# Patient Record
Sex: Female | Born: 1985 | Race: White | Hispanic: No | Marital: Single | Smoking: Never smoker
Health system: Southern US, Community
[De-identification: ages and names within clinical notes are randomized; demographics above are authoritative.]

---

## 2017-06-30 DIAGNOSIS — Z01419 Encounter for gynecological examination (general) (routine) without abnormal findings: Secondary | ICD-10-CM | POA: Diagnosis not present

## 2017-06-30 DIAGNOSIS — Z30431 Encounter for routine checking of intrauterine contraceptive device: Secondary | ICD-10-CM | POA: Diagnosis not present

## 2017-06-30 DIAGNOSIS — Z3009 Encounter for other general counseling and advice on contraception: Secondary | ICD-10-CM | POA: Diagnosis not present

## 2017-07-22 DIAGNOSIS — F909 Attention-deficit hyperactivity disorder, unspecified type: Secondary | ICD-10-CM | POA: Diagnosis not present

## 2018-01-20 DIAGNOSIS — Z0000 Encounter for general adult medical examination without abnormal findings: Secondary | ICD-10-CM | POA: Diagnosis not present

## 2018-01-20 DIAGNOSIS — Z136 Encounter for screening for cardiovascular disorders: Secondary | ICD-10-CM | POA: Diagnosis not present

## 2018-01-20 DIAGNOSIS — Z131 Encounter for screening for diabetes mellitus: Secondary | ICD-10-CM | POA: Diagnosis not present

## 2018-07-04 DIAGNOSIS — R4781 Slurred speech: Secondary | ICD-10-CM | POA: Diagnosis not present

## 2018-07-04 DIAGNOSIS — I10 Essential (primary) hypertension: Secondary | ICD-10-CM | POA: Diagnosis not present

## 2018-07-04 DIAGNOSIS — R410 Disorientation, unspecified: Secondary | ICD-10-CM | POA: Diagnosis not present

## 2018-07-05 ENCOUNTER — Emergency Department (HOSPITAL_COMMUNITY): Payer: BLUE CROSS/BLUE SHIELD

## 2018-07-05 ENCOUNTER — Encounter (HOSPITAL_COMMUNITY): Payer: Self-pay | Admitting: Emergency Medicine

## 2018-07-05 ENCOUNTER — Emergency Department (HOSPITAL_COMMUNITY)
Admission: EM | Admit: 2018-07-05 | Discharge: 2018-07-05 | Disposition: A | Discharge status: Home / Self Care | Payer: BLUE CROSS/BLUE SHIELD | Attending: Emergency Medicine | Admitting: Emergency Medicine

## 2018-07-05 DIAGNOSIS — S0990XA Unspecified injury of head, initial encounter: Secondary | ICD-10-CM | POA: Diagnosis not present

## 2018-07-05 DIAGNOSIS — S060X0A Concussion without loss of consciousness, initial encounter: Secondary | ICD-10-CM | POA: Diagnosis not present

## 2018-07-05 DIAGNOSIS — R410 Disorientation, unspecified: Secondary | ICD-10-CM | POA: Diagnosis not present

## 2018-07-05 DIAGNOSIS — Y939 Activity, unspecified: Secondary | ICD-10-CM | POA: Diagnosis not present

## 2018-07-05 DIAGNOSIS — Y998 Other external cause status: Secondary | ICD-10-CM | POA: Diagnosis not present

## 2018-07-05 DIAGNOSIS — M549 Dorsalgia, unspecified: Secondary | ICD-10-CM | POA: Diagnosis not present

## 2018-07-05 DIAGNOSIS — Y92410 Unspecified street and highway as the place of occurrence of the external cause: Secondary | ICD-10-CM | POA: Insufficient documentation

## 2018-07-05 DIAGNOSIS — M545 Low back pain, unspecified: Secondary | ICD-10-CM

## 2018-07-05 DIAGNOSIS — S3992XA Unspecified injury of lower back, initial encounter: Secondary | ICD-10-CM | POA: Diagnosis not present

## 2018-07-05 LAB — CBC
HEMATOCRIT: 39.3 % (ref 36.0–46.0)
Hemoglobin: 13.1 g/dL (ref 12.0–15.0)
MCH: 29.7 pg (ref 26.0–34.0)
MCHC: 33.3 g/dL (ref 30.0–36.0)
MCV: 89.1 fL (ref 80.0–100.0)
Platelets: 351 10*3/uL (ref 150–400)
RBC: 4.41 MIL/uL (ref 3.87–5.11)
RDW: 12.4 % (ref 11.5–15.5)
WBC: 7.5 10*3/uL (ref 4.0–10.5)
nRBC: 0 % (ref 0.0–0.2)

## 2018-07-05 LAB — COMPREHENSIVE METABOLIC PANEL
ALT: 16 U/L (ref 0–44)
AST: 17 U/L (ref 15–41)
Albumin: 4 g/dL (ref 3.5–5.0)
Alkaline Phosphatase: 70 U/L (ref 38–126)
Anion gap: 10 (ref 5–15)
BUN: 8 mg/dL (ref 6–20)
CHLORIDE: 106 mmol/L (ref 98–111)
CO2: 22 mmol/L (ref 22–32)
Calcium: 9.1 mg/dL (ref 8.9–10.3)
Creatinine, Ser: 0.65 mg/dL (ref 0.44–1.00)
GFR calc Af Amer: >60 mL/min (ref >60)
Glucose, Bld: 114 mg/dL — ABNORMAL HIGH (ref 70–99)
Potassium: 3.5 mmol/L (ref 3.5–5.1)
Sodium: 138 mmol/L (ref 135–145)
TOTAL PROTEIN: 7.2 g/dL (ref 6.5–8.1)
Total Bilirubin: 0.6 mg/dL (ref 0.3–1.2)

## 2018-07-05 LAB — URINALYSIS, ROUTINE W REFLEX MICROSCOPIC
Bilirubin Urine: NEGATIVE
Glucose, UA: NEGATIVE mg/dL
Hgb urine dipstick: NEGATIVE
Ketones, ur: NEGATIVE mg/dL
Leukocytes, UA: NEGATIVE
Nitrite: NEGATIVE
Protein, ur: NEGATIVE mg/dL
Specific Gravity, Urine: 1.004 — ABNORMAL LOW (ref 1.005–1.030)
pH: 6 (ref 5.0–8.0)

## 2018-07-05 LAB — I-STAT BETA HCG BLOOD, ED (MC, WL, AP ONLY): I-stat hCG, quantitative: <5 m[IU]/mL (ref <5)

## 2018-07-05 MED ORDER — SODIUM CHLORIDE 0.9% FLUSH: 3.0000 mL | Freq: Once | INTRAVENOUS | Status: DC

## 2018-07-05 MED ORDER — CYCLOBENZAPRINE HCL 10 MG PO TABS: 10.0000 mg | ORAL_TABLET | Freq: Two times a day (BID) | ORAL | 0 refills | Status: AC | PRN

## 2018-07-05 MED ORDER — IBUPROFEN 400 MG PO TABS
600.0000 mg | ORAL_TABLET | Freq: Once | ORAL | Status: AC
  Administered 2018-07-05: 600 mg via ORAL
  Filled 2018-07-05: qty 1

## 2018-07-05 NOTE — ED Notes (Signed)
Patient transported to CT 

## 2018-07-05 NOTE — ED Notes (Signed)
Pt reports she feels jumpy and very tired.  Reports anxiety but states "no more than normal."
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
## 2018-07-20 DIAGNOSIS — F909 Attention-deficit hyperactivity disorder, unspecified type: Secondary | ICD-10-CM | POA: Diagnosis not present

## 2018-07-20 DIAGNOSIS — R12 Heartburn: Secondary | ICD-10-CM | POA: Diagnosis not present

## 2019-08-17 ENCOUNTER — Ambulatory Visit: Payer: Self-pay | Attending: Internal Medicine

## 2019-08-17 DIAGNOSIS — Z23 Encounter for immunization: Secondary | ICD-10-CM

## 2019-08-17 NOTE — Progress Notes (Signed)
   Covid-19 Vaccination Clinic  Name:  Danielle Guerrero    MRN: [id number] DOB: Mar 19, 1986  08/17/2019  Danielle Guerrero was observed post Covid-19 immunization for 15 minutes without incident. She was provided with Vaccine Information Sheet and instruction to access the V-Safe system.   Danielle Guerrero was instructed to call 911 with any severe reactions post vaccine: Marland Kitchen Difficulty breathing  . Swelling of face and throat  . A fast heartbeat  . A bad rash all over body  . Dizziness and weakness   Immunizations Administered    Name Date Dose VIS Date Route   Pfizer COVID-19 Vaccine 08/17/2019  6:40 PM 0.3 mL 05/25/2019 Intramuscular   Manufacturer: [employer], [employer]   Lot: [id number]   NDC: [address]-6

## 2019-09-18 ENCOUNTER — Ambulatory Visit: Payer: Medicaid Other | Attending: Internal Medicine

## 2019-09-18 DIAGNOSIS — Z23 Encounter for immunization: Secondary | ICD-10-CM

## 2019-09-18 NOTE — Progress Notes (Signed)
   Covid-19 Vaccination Clinic  Name:  Danielle Guerrero    MRN: [id number] DOB: [birth year]  09/18/2019  [name] was observed post Covid-19 immunization for 15 minutes without incident. She was provided with Vaccine Information Sheet and instruction to access the V-Safe system.   [name] was instructed to call 911 with any severe reactions post vaccine: Marland Kitchen Difficulty breathing  . Swelling of face and throat  . A fast heartbeat  . A bad rash all over body  . Dizziness and weakness   Immunizations Administered    Name Date Dose VIS Date Route   Pfizer COVID-19 Vaccine 09/18/2019  8:52 AM 0.3 mL 05/25/2019 Intramuscular   Manufacturer: [employer], [employer]   Lot: [id number]   NDC: [address]-4
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
## 2021-07-13 ENCOUNTER — Other Ambulatory Visit (HOSPITAL_COMMUNITY): Payer: Self-pay

## 2021-07-13 MED ORDER — AMPHETAMINE-DEXTROAMPHETAMINE 20 MG PO TABS
20.0000 mg | ORAL_TABLET | Freq: Two times a day (BID) | ORAL | 0 refills | Status: AC | PRN
  Filled 2021-07-13: qty 60, 30d supply, fill #0

## 2021-09-08 DIAGNOSIS — Z20822 Contact with and (suspected) exposure to covid-19: Secondary | ICD-10-CM | POA: Diagnosis not present

## 2021-09-08 DIAGNOSIS — Z03818 Encounter for observation for suspected exposure to other biological agents ruled out: Secondary | ICD-10-CM | POA: Diagnosis not present

## 2021-09-10 DIAGNOSIS — J45909 Unspecified asthma, uncomplicated: Secondary | ICD-10-CM | POA: Diagnosis not present

## 2021-10-06 DIAGNOSIS — R12 Heartburn: Secondary | ICD-10-CM | POA: Diagnosis not present

## 2021-10-06 DIAGNOSIS — B009 Herpesviral infection, unspecified: Secondary | ICD-10-CM | POA: Diagnosis not present

## 2021-10-06 DIAGNOSIS — R69 Illness, unspecified: Secondary | ICD-10-CM | POA: Diagnosis not present

## 2022-03-11 DIAGNOSIS — Z23 Encounter for immunization: Secondary | ICD-10-CM | POA: Diagnosis not present

## 2022-03-30 DIAGNOSIS — R12 Heartburn: Secondary | ICD-10-CM | POA: Diagnosis not present

## 2022-03-30 DIAGNOSIS — B009 Herpesviral infection, unspecified: Secondary | ICD-10-CM | POA: Diagnosis not present

## 2022-03-30 DIAGNOSIS — R69 Illness, unspecified: Secondary | ICD-10-CM | POA: Diagnosis not present

## 2022-04-30 ENCOUNTER — Other Ambulatory Visit (HOSPITAL_COMMUNITY): Payer: Self-pay

## 2022-06-02 DIAGNOSIS — Z03818 Encounter for observation for suspected exposure to other biological agents ruled out: Secondary | ICD-10-CM | POA: Diagnosis not present

## 2022-06-02 DIAGNOSIS — B338 Other specified viral diseases: Secondary | ICD-10-CM | POA: Diagnosis not present

## 2022-06-02 DIAGNOSIS — R509 Fever, unspecified: Secondary | ICD-10-CM | POA: Diagnosis not present

## 2022-06-02 DIAGNOSIS — R52 Pain, unspecified: Secondary | ICD-10-CM | POA: Diagnosis not present

## 2022-06-02 DIAGNOSIS — J029 Acute pharyngitis, unspecified: Secondary | ICD-10-CM | POA: Diagnosis not present

## 2022-06-02 DIAGNOSIS — J101 Influenza due to other identified influenza virus with other respiratory manifestations: Secondary | ICD-10-CM | POA: Diagnosis not present

## 2022-09-28 DIAGNOSIS — F909 Attention-deficit hyperactivity disorder, unspecified type: Secondary | ICD-10-CM | POA: Diagnosis not present

## 2022-09-28 DIAGNOSIS — M549 Dorsalgia, unspecified: Secondary | ICD-10-CM | POA: Diagnosis not present

## 2022-10-18 ENCOUNTER — Emergency Department (HOSPITAL_COMMUNITY): Payer: 59

## 2022-10-18 ENCOUNTER — Other Ambulatory Visit: Payer: Self-pay

## 2022-10-18 ENCOUNTER — Emergency Department (HOSPITAL_COMMUNITY)
Admission: EM | Admit: 2022-10-18 | Discharge: 2022-10-19 | Disposition: A | Discharge status: Home / Self Care | Payer: 59 | Attending: Emergency Medicine | Admitting: Emergency Medicine

## 2022-10-18 DIAGNOSIS — Z1152 Encounter for screening for COVID-19: Secondary | ICD-10-CM | POA: Insufficient documentation

## 2022-10-18 DIAGNOSIS — J208 Acute bronchitis due to other specified organisms: Secondary | ICD-10-CM | POA: Diagnosis not present

## 2022-10-18 DIAGNOSIS — R0602 Shortness of breath: Secondary | ICD-10-CM | POA: Diagnosis not present

## 2022-10-18 DIAGNOSIS — R059 Cough, unspecified: Secondary | ICD-10-CM | POA: Diagnosis not present

## 2022-10-18 LAB — BASIC METABOLIC PANEL
Anion gap: 10 (ref 5–15)
BUN: 9 mg/dL (ref 6–20)
CO2: 24 mmol/L (ref 22–32)
Calcium: 9.1 mg/dL (ref 8.9–10.3)
Chloride: 103 mmol/L (ref 98–111)
Creatinine, Ser: 0.7 mg/dL (ref 0.44–1.00)
GFR, Estimated: >60 mL/min (ref >60)
Glucose, Bld: 87 mg/dL (ref 70–99)
Potassium: 3.7 mmol/L (ref 3.5–5.1)
Sodium: 137 mmol/L (ref 135–145)

## 2022-10-18 LAB — CBC
HCT: 39.8 % (ref 36.0–46.0)
Hemoglobin: 13.6 g/dL (ref 12.0–15.0)
MCH: 29.2 pg (ref 26.0–34.0)
MCHC: 34.2 g/dL (ref 30.0–36.0)
MCV: 85.4 fL (ref 80.0–100.0)
Platelets: 355 10*3/uL (ref 150–400)
RBC: 4.66 MIL/uL (ref 3.87–5.11)
RDW: 13.1 % (ref 11.5–15.5)
WBC: 11.7 10*3/uL — ABNORMAL HIGH (ref 4.0–10.5)
nRBC: 0 % (ref 0.0–0.2)

## 2022-10-18 LAB — I-STAT BETA HCG BLOOD, ED (MC, WL, AP ONLY): I-stat hCG, quantitative: <5 m[IU]/mL (ref <5)

## 2022-10-18 LAB — TROPONIN I (HIGH SENSITIVITY): Troponin I (High Sensitivity): 3 ng/L (ref <18)

## 2022-10-18 MED ORDER — IPRATROPIUM-ALBUTEROL 0.5-2.5 (3) MG/3ML IN SOLN
3.0000 mL | Freq: Once | RESPIRATORY_TRACT | Status: AC
  Administered 2022-10-18: 3 mL via RESPIRATORY_TRACT
  Filled 2022-10-18: qty 3

## 2022-10-18 NOTE — ED Triage Notes (Signed)
Pt arrives with c/o SOB that started about 2 days ago. Pt denies CP. Pt endorses cough.

## 2022-10-19 ENCOUNTER — Encounter (HOSPITAL_COMMUNITY): Payer: Self-pay

## 2022-10-19 ENCOUNTER — Emergency Department (HOSPITAL_COMMUNITY): Payer: 59

## 2022-10-19 DIAGNOSIS — R059 Cough, unspecified: Secondary | ICD-10-CM | POA: Diagnosis not present

## 2022-10-19 DIAGNOSIS — R0602 Shortness of breath: Secondary | ICD-10-CM | POA: Diagnosis not present

## 2022-10-19 DIAGNOSIS — J208 Acute bronchitis due to other specified organisms: Secondary | ICD-10-CM | POA: Diagnosis not present

## 2022-10-19 LAB — D-DIMER, QUANTITATIVE: D-Dimer, Quant: 0.55 ug/mL-FEU — ABNORMAL HIGH (ref 0.00–0.50)

## 2022-10-19 LAB — TROPONIN I (HIGH SENSITIVITY): Troponin I (High Sensitivity): <2 ng/L (ref <18)

## 2022-10-19 LAB — RESP PANEL BY RT-PCR (RSV, FLU A&B, COVID)  RVPGX2
Influenza A by PCR: NEGATIVE
Influenza B by PCR: NEGATIVE
Resp Syncytial Virus by PCR: NEGATIVE
SARS Coronavirus 2 by RT PCR: NEGATIVE

## 2022-10-19 MED ORDER — IOHEXOL 350 MG/ML SOLN
66.0000 mL | Freq: Once | INTRAVENOUS | Status: AC | PRN
  Administered 2022-10-19: 66 mL via INTRAVENOUS

## 2022-10-19 MED ORDER — ALBUTEROL SULFATE HFA 108 (90 BASE) MCG/ACT IN AERS
2.0000 | INHALATION_SPRAY | Freq: Once | RESPIRATORY_TRACT | Status: AC
  Administered 2022-10-19: 2 via RESPIRATORY_TRACT
  Filled 2022-10-19: qty 6.7

## 2022-10-19 MED ORDER — SODIUM CHLORIDE 0.9 % IV BOLUS
1000.0000 mL | Freq: Once | INTRAVENOUS | Status: AC
  Administered 2022-10-19: 1000 mL via INTRAVENOUS

## 2022-10-19 MED ORDER — PREDNISONE 10 MG PO TABS: 20.0000 mg | ORAL_TABLET | Freq: Every day | ORAL | 0 refills | Status: AC

## 2022-10-19 MED ORDER — METHYLPREDNISOLONE SODIUM SUCC 125 MG IJ SOLR
125.0000 mg | Freq: Once | INTRAMUSCULAR | Status: AC
  Administered 2022-10-19: 125 mg via INTRAVENOUS
  Filled 2022-10-19: qty 2

## 2022-10-19 NOTE — ED Provider Notes (Signed)
EMERGENCY DEPARTMENT AT [facility] Provider Note   CSN: [id number] Arrival date & time: 10/18/22  2218     History  Chief Complaint  Patient presents with   Shortness of Breath    Danielle Guerrero is a 37 y.o. female.  HPI   Patient that significant medical history presented with complaints of shortness of breath as well as chest pain.  Started on Saturday, states that he has had a sore throat and then developed cough and congestion.  She has a cough has been nonproductive, states that she feels short of breath because she cannot take a deep breath because her lungs hurt, she has no history of PEs or DVTs she is currently not on oral birth control, no recent surgeries no long immobilization endorsing any leg pain or leg swelling, no cardiac history, denies tobacco use.    Home Medications Prior to Admission medications   Medication Sig Start Date End Date Taking? Authorizing Provider  predniSONE (DELTASONE) 10 MG tablet Take 2 tablets (20 mg total) by mouth daily for 5 days. 10/19/22 10/24/22 Yes Carroll Sage, PA-C  amphetamine-dextroamphetamine (ADDERALL) 20 MG tablet Take 1 tablet (20 mg total) by mouth 2 (two) times daily as needed. 07/13/21     cyclobenzaprine (FLEXERIL) 10 MG tablet Take 1 tablet (10 mg total) by mouth 2 (two) times daily as needed. 07/05/18   McDonald, Mia A, PA-C      Allergies    Patient has no known allergies.    Review of Systems   Review of Systems  Constitutional:  Negative for chills and fever.  Respiratory:  Positive for chest tightness and shortness of breath.   Cardiovascular:  Negative for chest pain.  Gastrointestinal:  Negative for abdominal pain.  Neurological:  Negative for headaches.    Physical Exam Updated Vital Signs BP 124/86   Pulse 95   Temp 97.6 F (36.4 C) (Oral)   Resp 19   SpO2 100%  Physical Exam Vitals and nursing note reviewed.  Constitutional:      General: She is not in acute  distress.    Appearance: She is not ill-appearing.  HENT:     Head: Normocephalic and atraumatic.     Nose: No congestion.  Eyes:     Conjunctiva/sclera: Conjunctivae normal.  Cardiovascular:     Rate and Rhythm: Normal rate and regular rhythm.     Pulses: Normal pulses.     Heart sounds: No murmur heard.    No friction rub. No gallop.  Pulmonary:     Effort: No respiratory distress.     Breath sounds: No wheezing, rhonchi or rales.     Comments: Speaking full sentences, patient had slight expiratory wheezing heard bilaterally, no rales or rhonchi noted. Musculoskeletal:     Right lower leg: No edema.     Left lower leg: No edema.     Comments: No unilateral leg swelling no calf tenderness no palpable cords.  Skin:    General: Skin is warm and dry.  Neurological:     Mental Status: She is alert.  Psychiatric:        Mood and Affect: Mood normal.     ED Results / Procedures / Treatments   Labs (all labs ordered are listed, but only abnormal results are displayed) Labs Reviewed  CBC - Abnormal; Notable for the following components:      Result Value   WBC 11.7 (*)    All other components within  normal limits  D-DIMER, QUANTITATIVE - Abnormal; Notable for the following components:   D-Dimer, Quant 0.55 (*)    All other components within normal limits  RESP PANEL BY RT-PCR (RSV, FLU A&B, COVID)  RVPGX2  BASIC METABOLIC PANEL  I-STAT BETA HCG BLOOD, ED ([facility], [facility], [facility] ONLY)  TROPONIN I (HIGH SENSITIVITY)  TROPONIN I (HIGH SENSITIVITY)    EKG None  Radiology CT Angio Chest PE W and/or Wo Contrast  Result Date: 10/19/2022 CLINICAL DATA:  Pulmonary embolism suspected, high probability. Shortness of breath and cough. EXAM: CT ANGIOGRAPHY CHEST WITH CONTRAST TECHNIQUE: Multidetector CT imaging of the chest was performed using the standard protocol during bolus administration of intravenous contrast. Multiplanar CT image reconstructions and MIPs were obtained to evaluate the  vascular anatomy. RADIATION DOSE REDUCTION: This exam was performed according to the departmental dose-optimization program which includes automated exposure control, adjustment of the mA and/or kV according to patient size and/or use of iterative reconstruction technique. CONTRAST:  66mL OMNIPAQUE IOHEXOL 350 MG/ML SOLN COMPARISON:  None Available. FINDINGS: Cardiovascular: The heart is normal in size and there is no pericardial effusion. The aorta and pulmonary trunk are normal in caliber. No evidence of pulmonary embolism. Mediastinum/Nodes: No enlarged mediastinal, hilar, or axillary lymph nodes. Thyroid gland, trachea, and esophagus demonstrate no significant findings. Lungs/Pleura: Mild bronchial wall thickening is noted bilaterally. Mild mucous plugging is noted in the left lower lobe. No consolidation, effusion, or pneumothorax. Upper Abdomen: No acute abnormality. Musculoskeletal: No acute osseous abnormality. Review of the MIP images confirms the above findings. IMPRESSION: 1. No evidence of pulmonary embolism. 2. Bronchial wall thickening bilaterally with mild mucous plugging in the left lower lobe. Electronically Signed   By: Thornell Sartorius M.D.   On: 10/19/2022 03:59   DG Chest 2 View  Result Date: 10/18/2022 CLINICAL DATA:  Shortness of breath. EXAM: CHEST - 2 VIEW COMPARISON:  None Available. FINDINGS: The heart size and mediastinal contours are within normal limits. Both lungs are clear. No acute osseous abnormality. IMPRESSION: No active cardiopulmonary disease. Electronically Signed   By: Thornell Sartorius M.D.   On: 10/18/2022 23:25    Procedures Procedures    Medications Ordered in ED Medications  ipratropium-albuterol (DUONEB) 0.5-2.5 (3) MG/3ML nebulizer solution 3 mL (3 mLs Nebulization Given 10/18/22 2302)  methylPREDNISolone sodium succinate (SOLU-MEDROL) 125 mg/2 mL injection 125 mg (125 mg Intravenous Given 10/19/22 0308)  sodium chloride 0.9 % bolus 1,000 mL (1,000 mLs Intravenous New  Bag/Given 10/19/22 0308)  albuterol (VENTOLIN HFA) 108 (90 Base) MCG/ACT inhaler 2 puff (2 puffs Inhalation Given 10/19/22 0309)  iohexol (OMNIPAQUE) 350 MG/ML injection 66 mL (66 mLs Intravenous Contrast Given 10/19/22 0344)    ED Course/ Medical Decision Making/ A&P                             Medical Decision Making Amount and/or Complexity of Data Reviewed Labs: ordered. Radiology: ordered.  Risk Prescription drug management.   This patient presents to the ED for concern of shortness of breath, this involves an extensive number of treatment options, and is a complaint that carries with it a high risk of complications and morbidity.  The differential diagnosis includes PE, ACS, asthma, bronchitis, pneumonia    Additional history obtained:  Additional history obtained from N/A External records from outside source obtained and reviewed including recent ER note   Co morbidities that complicate the patient evaluation  N/a  Social Determinants of  Health:  N/a    Lab Tests:  I Ordered, and personally interpreted labs.  The pertinent results include: CBC shows slight leukocytosis 11.7, BNP unremarkable, respiratory panel negative, negative delta troponin   Imaging Studies ordered:  I ordered imaging studies including chest x-ray I independently visualized and interpreted imaging which showed unremarkable I agree with the radiologist interpretation   Cardiac Monitoring:  The patient was maintained on a cardiac monitor.  I personally viewed and interpreted the cardiac monitored which showed an underlying rhythm of: Sinus tach without signs of ischemia   Medicines ordered and prescription drug management:  I ordered medication including bronchodilators I have reviewed the patients home medicines and have made adjustments as needed  Critical Interventions:  N/a   Reevaluation:  Presents with shortness of breath, triage obtained lab work imaging which I personally  reviewed, patient was given a bronchodilator prior to my examination, lab work is unremarkable, patient has slight wheezing heard during my exam, will provide with additional bronchodilator, and steroids.  Suspect likely this is a URI but due to her tachycardia and shortness of breath, rule out PE will obtain D-dimer for further evaluation.  D-dimer is elevated we will send down for CTA of chest rule out PE  Reassessed lung sounds have improved immensely, patient she is feeling better, agreeable discharge at this time.    Consultations Obtained:  N/a    Test Considered:  N/a    Rule out I have low suspicion for ACS as history is atypical, patient has no cardiac history, EKG was without signs of ischemia, patient had negative delta troponin.  Low suspicion for PE, dissection, AAA CT imaging is all negative these findings.  Low suspicion for systemic infection as patient is nontoxic-appearing, vital signs reassuring, no obvious source infection noted on exam.     Dispostion and problem list  After consideration of the diagnostic results and the patients response to treatment, I feel that the patent would benefit from discharge.  Viral bronchitis-patient was given a bronchodilator, will provide with a short course of steroids, will defer on antibiotics as she is nontoxic-appearing atypical for pneumonia develop at this time.            Final Clinical Impression(s) / ED Diagnoses Final diagnoses:  Viral bronchitis    Rx / DC Orders ED Discharge Orders          Ordered    predniSONE (DELTASONE) 10 MG tablet  Daily        10/19/22 0510              Carroll Sage, PA-C 10/19/22 1610    Tilden Fossa, MD 10/19/22 [phone]

## 2022-10-19 NOTE — Discharge Instructions (Signed)
Likely a viral infection, recommend over-the-counter pain medications like ibuprofen Tylenol for fever and pain control, nasal decongestions like Flonase and Zyrtec, Mucinex for cough.  If not eating recommend supplementing with Gatorade to help with electrolyte supplementation.  Please use inhaler 1 to 2 puffs every 4-6 hours as needed for shortness of breath, given the steroids take as prescribed.  Follow-up PCP for further evaluation.  Come back to the emergency department if you develop chest pain, shortness of breath, severe abdominal pain, uncontrolled nausea, vomiting, diarrhea.

## 2022-10-19 NOTE — ED Notes (Signed)
Patient transported to CT 

## 2022-11-22 DIAGNOSIS — M5451 Vertebrogenic low back pain: Secondary | ICD-10-CM | POA: Diagnosis not present

## 2022-11-25 DIAGNOSIS — M5451 Vertebrogenic low back pain: Secondary | ICD-10-CM | POA: Diagnosis not present

## 2022-11-30 DIAGNOSIS — M5451 Vertebrogenic low back pain: Secondary | ICD-10-CM | POA: Diagnosis not present

## 2022-12-02 DIAGNOSIS — M5451 Vertebrogenic low back pain: Secondary | ICD-10-CM | POA: Diagnosis not present

## 2022-12-08 DIAGNOSIS — M5451 Vertebrogenic low back pain: Secondary | ICD-10-CM | POA: Diagnosis not present

## 2022-12-09 DIAGNOSIS — M5451 Vertebrogenic low back pain: Secondary | ICD-10-CM | POA: Diagnosis not present

## 2022-12-14 DIAGNOSIS — M5451 Vertebrogenic low back pain: Secondary | ICD-10-CM | POA: Diagnosis not present

## 2022-12-15 DIAGNOSIS — M5451 Vertebrogenic low back pain: Secondary | ICD-10-CM | POA: Diagnosis not present

## 2022-12-23 DIAGNOSIS — M5451 Vertebrogenic low back pain: Secondary | ICD-10-CM | POA: Diagnosis not present

## 2022-12-24 DIAGNOSIS — M5451 Vertebrogenic low back pain: Secondary | ICD-10-CM | POA: Diagnosis not present

## 2022-12-30 DIAGNOSIS — M5451 Vertebrogenic low back pain: Secondary | ICD-10-CM | POA: Diagnosis not present

## 2022-12-31 DIAGNOSIS — M5451 Vertebrogenic low back pain: Secondary | ICD-10-CM | POA: Diagnosis not present

## 2023-01-05 DIAGNOSIS — M5451 Vertebrogenic low back pain: Secondary | ICD-10-CM | POA: Diagnosis not present

## 2023-01-12 DIAGNOSIS — M5451 Vertebrogenic low back pain: Secondary | ICD-10-CM | POA: Diagnosis not present

## 2023-01-27 DIAGNOSIS — M5451 Vertebrogenic low back pain: Secondary | ICD-10-CM | POA: Diagnosis not present

## 2023-04-01 DIAGNOSIS — Z23 Encounter for immunization: Secondary | ICD-10-CM | POA: Diagnosis not present

## 2023-04-01 DIAGNOSIS — Z0000 Encounter for general adult medical examination without abnormal findings: Secondary | ICD-10-CM | POA: Diagnosis not present

## 2023-04-01 DIAGNOSIS — R12 Heartburn: Secondary | ICD-10-CM | POA: Diagnosis not present

## 2023-04-01 DIAGNOSIS — F909 Attention-deficit hyperactivity disorder, unspecified type: Secondary | ICD-10-CM | POA: Diagnosis not present

## 2023-04-01 DIAGNOSIS — Z124 Encounter for screening for malignant neoplasm of cervix: Secondary | ICD-10-CM | POA: Diagnosis not present

## 2023-04-07 DIAGNOSIS — Z0000 Encounter for general adult medical examination without abnormal findings: Secondary | ICD-10-CM | POA: Diagnosis not present

## 2023-04-26 DIAGNOSIS — Z124 Encounter for screening for malignant neoplasm of cervix: Secondary | ICD-10-CM | POA: Diagnosis not present

## 2023-05-03 DIAGNOSIS — Z3202 Encounter for pregnancy test, result negative: Secondary | ICD-10-CM | POA: Diagnosis not present

## 2023-05-03 DIAGNOSIS — Z30431 Encounter for routine checking of intrauterine contraceptive device: Secondary | ICD-10-CM | POA: Diagnosis not present

## 2023-08-22 ENCOUNTER — Other Ambulatory Visit (HOSPITAL_COMMUNITY): Payer: Self-pay

## 2023-08-23 ENCOUNTER — Other Ambulatory Visit (HOSPITAL_COMMUNITY): Payer: Self-pay

## 2023-08-23 MED ORDER — AMPHETAMINE-DEXTROAMPHETAMINE 20 MG PO TABS
20.0000 mg | ORAL_TABLET | Freq: Two times a day (BID) | ORAL | 0 refills | Status: DC
  Filled 2023-08-23: qty 60, 30d supply, fill #0

## 2023-11-16 ENCOUNTER — Other Ambulatory Visit (HOSPITAL_COMMUNITY): Payer: Self-pay

## 2023-11-16 MED ORDER — AMPHETAMINE-DEXTROAMPHETAMINE 20 MG PO TABS
20.0000 mg | ORAL_TABLET | Freq: Two times a day (BID) | ORAL | 0 refills | Status: AC
  Filled 2023-12-15: qty 60, 30d supply, fill #0

## 2023-11-16 MED ORDER — AMPHETAMINE-DEXTROAMPHETAMINE 20 MG PO TABS
20.0000 mg | ORAL_TABLET | Freq: Two times a day (BID) | ORAL | 0 refills | Status: AC
  Filled 2023-11-16: qty 60, 30d supply, fill #0

## 2023-11-16 MED ORDER — AMPHETAMINE-DEXTROAMPHETAMINE 20 MG PO TABS
20.0000 mg | ORAL_TABLET | Freq: Two times a day (BID) | ORAL | 0 refills | Status: DC
  Filled 2024-01-13: qty 60, 30d supply, fill #0

## 2023-12-15 ENCOUNTER — Other Ambulatory Visit (HOSPITAL_COMMUNITY): Payer: Self-pay

## 2024-01-13 ENCOUNTER — Other Ambulatory Visit (HOSPITAL_COMMUNITY): Payer: Self-pay

## 2024-01-13 ENCOUNTER — Other Ambulatory Visit: Payer: Self-pay

## 2024-02-10 ENCOUNTER — Other Ambulatory Visit (HOSPITAL_COMMUNITY): Payer: Self-pay

## 2024-02-10 MED ORDER — AMPHETAMINE-DEXTROAMPHETAMINE 20 MG PO TABS
20.0000 mg | ORAL_TABLET | Freq: Two times a day (BID) | ORAL | 0 refills | Status: DC
  Filled 2024-02-10: qty 60, 30d supply, fill #0

## 2024-02-29 ENCOUNTER — Other Ambulatory Visit: Payer: Self-pay | Admitting: Nurse Practitioner

## 2024-02-29 ENCOUNTER — Other Ambulatory Visit (HOSPITAL_COMMUNITY): Payer: Self-pay

## 2024-02-29 DIAGNOSIS — N6320 Unspecified lump in the left breast, unspecified quadrant: Secondary | ICD-10-CM

## 2024-02-29 MED ORDER — SULFAMETHOXAZOLE-TRIMETHOPRIM 800-160 MG PO TABS
1.0000 | ORAL_TABLET | Freq: Two times a day (BID) | ORAL | 0 refills | Status: AC
  Filled 2024-02-29: qty 14, 7d supply, fill #0

## 2024-03-07 ENCOUNTER — Ambulatory Visit
Admission: RE | Admit: 2024-03-07 | Discharge: 2024-03-07 | Disposition: A | Discharge status: Home / Self Care | Source: Ambulatory Visit | Attending: Nurse Practitioner | Admitting: Nurse Practitioner

## 2024-03-07 ENCOUNTER — Ambulatory Visit
Admission: RE | Admit: 2024-03-07 | Discharge: 2024-03-07 | Disposition: A | Discharge status: Home / Self Care | Source: Ambulatory Visit | Attending: Nurse Practitioner

## 2024-03-07 DIAGNOSIS — N6320 Unspecified lump in the left breast, unspecified quadrant: Secondary | ICD-10-CM

## 2024-03-08 ENCOUNTER — Other Ambulatory Visit (HOSPITAL_COMMUNITY): Payer: Self-pay

## 2024-03-10 ENCOUNTER — Other Ambulatory Visit (HOSPITAL_COMMUNITY): Payer: Self-pay

## 2024-03-10 MED ORDER — AMPHETAMINE-DEXTROAMPHETAMINE 20 MG PO TABS
20.0000 mg | ORAL_TABLET | Freq: Two times a day (BID) | ORAL | 0 refills | Status: DC
  Filled 2024-03-10: qty 60, 30d supply, fill #0

## 2024-03-13 ENCOUNTER — Other Ambulatory Visit: Payer: Self-pay | Admitting: Nurse Practitioner

## 2024-03-13 DIAGNOSIS — N6310 Unspecified lump in the right breast, unspecified quadrant: Secondary | ICD-10-CM

## 2024-03-13 DIAGNOSIS — N6320 Unspecified lump in the left breast, unspecified quadrant: Secondary | ICD-10-CM

## 2024-03-25 ENCOUNTER — Other Ambulatory Visit (HOSPITAL_COMMUNITY): Payer: Self-pay

## 2024-03-26 ENCOUNTER — Other Ambulatory Visit (HOSPITAL_COMMUNITY): Payer: Self-pay

## 2024-03-26 MED ORDER — ACYCLOVIR 400 MG PO TABS
400.0000 mg | ORAL_TABLET | Freq: Two times a day (BID) | ORAL | 0 refills | Status: DC
  Filled 2024-03-26: qty 60, 30d supply, fill #0

## 2024-03-28 ENCOUNTER — Other Ambulatory Visit (HOSPITAL_COMMUNITY): Payer: Self-pay

## 2024-04-06 ENCOUNTER — Other Ambulatory Visit (HOSPITAL_COMMUNITY): Payer: Self-pay

## 2024-04-06 MED ORDER — AMPHETAMINE-DEXTROAMPHETAMINE 20 MG PO TABS
20.0000 mg | ORAL_TABLET | Freq: Two times a day (BID) | ORAL | 0 refills | Status: AC
  Filled 2024-04-06: qty 60, 30d supply, fill #0

## 2024-04-20 ENCOUNTER — Other Ambulatory Visit (HOSPITAL_BASED_OUTPATIENT_CLINIC_OR_DEPARTMENT_OTHER): Payer: Self-pay

## 2024-04-20 ENCOUNTER — Other Ambulatory Visit (HOSPITAL_COMMUNITY): Payer: Self-pay

## 2024-04-20 MED ORDER — AMPHETAMINE-DEXTROAMPHETAMINE 20 MG PO TABS
20.0000 mg | ORAL_TABLET | Freq: Two times a day (BID) | ORAL | 0 refills | Status: AC
  Filled 2024-05-05: qty 60, 30d supply, fill #0

## 2024-04-20 MED ORDER — AMPHETAMINE-DEXTROAMPHETAMINE 20 MG PO TABS
20.0000 mg | ORAL_TABLET | Freq: Two times a day (BID) | ORAL | 0 refills | Status: AC
  Filled 2024-06-03: qty 60, 30d supply, fill #0

## 2024-04-20 MED ORDER — AMPHETAMINE-DEXTROAMPHETAMINE 20 MG PO TABS
20.0000 mg | ORAL_TABLET | Freq: Two times a day (BID) | ORAL | 0 refills | Status: AC
  Filled 2024-07-02: qty 60, 30d supply, fill #0

## 2024-05-05 ENCOUNTER — Other Ambulatory Visit (HOSPITAL_COMMUNITY): Payer: Self-pay

## 2024-05-09 ENCOUNTER — Other Ambulatory Visit (HOSPITAL_COMMUNITY): Payer: Self-pay

## 2024-05-09 MED ORDER — ACYCLOVIR 400 MG PO TABS
400.0000 mg | ORAL_TABLET | Freq: Two times a day (BID) | ORAL | 0 refills | Status: AC
  Filled 2024-05-09: qty 60, 30d supply, fill #0

## 2024-06-03 ENCOUNTER — Other Ambulatory Visit (HOSPITAL_COMMUNITY): Payer: Self-pay

## 2024-06-12 ENCOUNTER — Other Ambulatory Visit (HOSPITAL_COMMUNITY): Payer: Self-pay

## 2024-06-12 MED ORDER — AMPHETAMINE-DEXTROAMPHETAMINE 20 MG PO TABS: 20.0000 mg | ORAL_TABLET | Freq: Two times a day (BID) | ORAL | 0 refills | Status: AC

## 2024-07-02 ENCOUNTER — Other Ambulatory Visit (HOSPITAL_COMMUNITY): Payer: Self-pay
